# Patient Record
Sex: Male | Born: 1990 | Race: Black or African American | Hispanic: No | Marital: Single | Smoking: Current every day smoker
Health system: Southern US, Community
[De-identification: ages and names within clinical notes are randomized; demographics above are authoritative.]

---

## 2009-02-11 ENCOUNTER — Emergency Department: Payer: Self-pay | Admitting: Internal Medicine

## 2009-04-04 ENCOUNTER — Inpatient Hospital Stay: Payer: Self-pay | Admitting: Psychiatry

## 2009-07-13 ENCOUNTER — Emergency Department: Payer: Self-pay | Admitting: Unknown Physician Specialty

## 2009-11-18 ENCOUNTER — Emergency Department: Payer: Self-pay | Admitting: Emergency Medicine

## 2010-01-12 ENCOUNTER — Emergency Department: Payer: Self-pay | Admitting: Unknown Physician Specialty

## 2010-06-13 ENCOUNTER — Emergency Department: Payer: Self-pay | Admitting: Emergency Medicine

## 2010-10-08 ENCOUNTER — Emergency Department: Payer: Self-pay | Admitting: *Deleted

## 2010-12-15 ENCOUNTER — Emergency Department: Payer: Self-pay | Admitting: Emergency Medicine

## 2012-03-25 ENCOUNTER — Emergency Department: Payer: Self-pay | Admitting: Emergency Medicine

## 2012-03-25 LAB — RAPID INFLUENZA A&B ANTIGENS

## 2012-03-28 LAB — BETA STREP CULTURE(ARMC)

## 2012-11-28 ENCOUNTER — Emergency Department: Payer: Self-pay | Admitting: Internal Medicine

## 2013-12-05 ENCOUNTER — Emergency Department: Payer: Self-pay | Admitting: Internal Medicine

## 2013-12-05 LAB — CBC WITH DIFFERENTIAL/PLATELET
Basophil #: 0 10*3/uL (ref 0.0–0.1)
Basophil %: 0.6 %
Eosinophil #: 0.3 10*3/uL (ref 0.0–0.7)
Eosinophil %: 4.1 %
HCT: 44.3 % (ref 40.0–52.0)
HGB: 14.6 g/dL (ref 13.0–18.0)
Lymphocyte #: 1.3 10*3/uL (ref 1.0–3.6)
Lymphocyte %: 18.7 %
MCH: 29.2 pg (ref 26.0–34.0)
MCHC: 33 g/dL (ref 32.0–36.0)
MCV: 88 fL (ref 80–100)
MONOS PCT: 11.4 %
Monocyte #: 0.8 x10 3/mm (ref 0.2–1.0)
Neutrophil #: 4.5 10*3/uL (ref 1.4–6.5)
Neutrophil %: 65.2 %
PLATELETS: 198 10*3/uL (ref 150–440)
RBC: 5.01 10*6/uL (ref 4.40–5.90)
RDW: 13.8 % (ref 11.5–14.5)
WBC: 6.8 10*3/uL (ref 3.8–10.6)

## 2013-12-05 LAB — COMPREHENSIVE METABOLIC PANEL
ANION GAP: 7 (ref 7–16)
AST: 31 U/L (ref 15–37)
Albumin: 4 g/dL (ref 3.4–5.0)
Alkaline Phosphatase: 82 U/L
BILIRUBIN TOTAL: 0.7 mg/dL (ref 0.2–1.0)
BUN: 20 mg/dL — ABNORMAL HIGH (ref 7–18)
CREATININE: 1.19 mg/dL (ref 0.60–1.30)
Calcium, Total: 8.8 mg/dL (ref 8.5–10.1)
Chloride: 105 mmol/L (ref 98–107)
Co2: 29 mmol/L (ref 21–32)
EGFR (African American): >60
Glucose: 91 mg/dL (ref 65–99)
Osmolality: 283 (ref 275–301)
POTASSIUM: 3.5 mmol/L (ref 3.5–5.1)
SGPT (ALT): 21 U/L
Sodium: 141 mmol/L (ref 136–145)
Total Protein: 7.6 g/dL (ref 6.4–8.2)

## 2013-12-05 LAB — URINALYSIS, COMPLETE
BACTERIA: NONE SEEN
BILIRUBIN, UR: NEGATIVE
BLOOD: NEGATIVE
Glucose,UR: NEGATIVE mg/dL (ref 0–75)
Nitrite: NEGATIVE
PH: 6 (ref 4.5–8.0)
PROTEIN: NEGATIVE
RBC,UR: 2 /HPF (ref 0–5)
SPECIFIC GRAVITY: 1.025 (ref 1.003–1.030)
Squamous Epithelial: <1
WBC UR: 15 /HPF (ref 0–5)

## 2014-09-06 ENCOUNTER — Emergency Department
Admission: EM | Admit: 2014-09-06 | Discharge: 2014-09-06 | Disposition: A | Discharge status: Home / Self Care | Payer: Self-pay | Attending: Emergency Medicine | Admitting: Emergency Medicine

## 2014-09-06 ENCOUNTER — Encounter: Payer: Self-pay | Admitting: Emergency Medicine

## 2014-09-06 DIAGNOSIS — K0510 Chronic gingivitis, plaque induced: Secondary | ICD-10-CM | POA: Insufficient documentation

## 2014-09-06 DIAGNOSIS — Z720 Tobacco use: Secondary | ICD-10-CM | POA: Insufficient documentation

## 2014-09-06 DIAGNOSIS — K029 Dental caries, unspecified: Secondary | ICD-10-CM | POA: Insufficient documentation

## 2014-09-06 MED ORDER — AMOXICILLIN 500 MG PO CAPS
500.0000 mg | ORAL_CAPSULE | Freq: Once | ORAL | Status: AC
Start: 2014-09-06 — End: 2014-09-06
  Administered 2014-09-06: 500 mg via ORAL
  Filled 2014-09-06: qty 1

## 2014-09-06 MED ORDER — LIDOCAINE VISCOUS 2 % MT SOLN
15.0000 mL | Freq: Once | OROMUCOSAL | Status: AC
  Administered 2014-09-06: 15 mL via OROMUCOSAL
  Filled 2014-09-06: qty 15

## 2014-09-06 MED ORDER — KETOROLAC TROMETHAMINE 10 MG PO TABS: 10.0000 mg | ORAL_TABLET | Freq: Three times a day (TID) | ORAL | Status: DC | PRN

## 2014-09-06 MED ORDER — OXYCODONE-ACETAMINOPHEN 5-325 MG PO TABS
1.0000 | ORAL_TABLET | Freq: Once | ORAL | Status: AC
Start: 2014-09-06 — End: 2014-09-06
  Administered 2014-09-06: 1 via ORAL
  Filled 2014-09-06: qty 1

## 2014-09-06 MED ORDER — KETOROLAC TROMETHAMINE 10 MG PO TABS: 10.0000 mg | ORAL_TABLET | Freq: Once | ORAL | Status: DC

## 2014-09-06 MED ORDER — AMOXICILLIN 500 MG PO CAPS: 500.0000 mg | ORAL_CAPSULE | Freq: Two times a day (BID) | ORAL | Status: AC

## 2014-09-06 NOTE — ED Notes (Signed)
Patient present to ED with left sided dental pain since yesterday, swelling noted to left side of gum. Patient denies any other complaints at this time. NAD noted at this time. Patient alert and oriented x 4, respirations even and unlabored. Call bell within reach, family at bedside.

## 2014-09-06 NOTE — ED Notes (Signed)

## 2014-09-06 NOTE — ED Notes (Signed)
Patient ambulatory to triage with steady gait, without difficulty or distress noted; pt reports left sided dental pain since yesterday 

## 2014-09-06 NOTE — ED Provider Notes (Signed)
[facility] Emergency Department Provider Note  ____________________________________________  Time seen: 5:30 AM  I have reviewed the triage vital signs and the nursing notes.   HISTORY  Chief Complaint Dental Pain      HPI Justin Camacho is a 24 y.o. male presents with left maxillary dental pain times one day. Patient denies any fever no neck pain or swelling   Past medical history None There are no active problems to display for this patient.   Past surgical history None  Current Outpatient Rx  Name  Route  Sig  Dispense  Refill  . amoxicillin (AMOXIL) 500 MG capsule   Oral   Take 1 capsule (500 mg total) by mouth 2 (two) times daily.   20 capsule   0   . ketorolac (TORADOL) 10 MG tablet   Oral   Take 1 tablet (10 mg total) by mouth every 8 (eight) hours as needed.   20 tablet   0     Allergies Review of patient's allergies indicates no known allergies.  No family history on file.  Social History History  Substance Use Topics  . Smoking status: Current Every Day Smoker -- 1.00 packs/day    Types: Cigarettes  . Smokeless tobacco: Not on file  . Alcohol Use: No    Review of Systems  Constitutional: Negative for fever. Eyes: Negative for visual changes. ENT: Negative for sore throat. Positive dental caries Cardiovascular: Negative for chest pain. Respiratory: Negative for shortness of breath. Gastrointestinal: Negative for abdominal pain, vomiting and diarrhea. Genitourinary: Negative for dysuria. Musculoskeletal: Negative for back pain. Skin: Negative for rash. Neurological: Negative for headaches, focal weakness or numbness.   10-point ROS otherwise negative.  ____________________________________________   PHYSICAL EXAM:  VITAL SIGNS: ED Triage Vitals  Enc Vitals Group     BP 09/06/14 0512 134/101 mmHg     Pulse Rate 09/06/14 0512 60     Resp 09/06/14 0512 18     Temp 09/06/14 0512 98 F (36.7 C)   Temp Source 09/06/14 0512 Oral     SpO2 09/06/14 0512 95 %     Weight 09/06/14 0512 152 lb (68.947 kg)     Height 09/06/14 0512  (1.753 m)     Head Cir --      Peak Flow --      Pain Score 09/06/14 0512 10     Pain Loc --      Pain Edu? --      Excl. in GC? --     Constitutional: Alert and oriented. Well appearing and in no distress. Eyes: Conjunctivae are normal. PERRL. Normal extraocular movements. ENT   Head: Normocephalic and atraumatic.   Nose: No congestion/rhinnorhea.   Mouth/Throat: Mucous membranes are moist. Multiple dental caries noted and gingivitis   Neck: No stridor. Hematological/Lymphatic/Immunilogical: No cervical lymphadenopathy.     INITIAL IMPRESSION / ASSESSMENT AND PLAN / ED COURSE  Pertinent labs & imaging results that were available during my care of the patient were reviewed by me and considered in my medical decision making (see chart for details).  Patient received viscous lidocaine amoxicillin and Percocet  ____________________________________________   FINAL CLINICAL IMPRESSION(S) / ED DIAGNOSES  Final diagnoses:  Pain due to dental caries  Gingivitis      Darci Current, MD 09/06/14 [phone]

## 2014-09-06 NOTE — Discharge Instructions (Signed)
Dental Caries °Dental caries (also called tooth decay) is the most common oral disease. It can occur at any age but is more common in children and young adults.  °HOW DENTAL CARIES DEVELOPS  °The process of decay begins when bacteria and foods (particularly sugars and starches) combine in your mouth to produce plaque. Plaque is a substance that sticks to the hard, outer surface of a tooth (enamel). The bacteria in plaque produce acids that attack enamel. These acids may also attack the root surface of a tooth (cementum) if it is exposed. Repeated attacks dissolve these surfaces and create holes in the tooth (cavities). If left untreated, the acids destroy the other layers of the tooth.  °RISK FACTORS °· Frequent sipping of sugary beverages.   °· Frequent snacking on sugary and starchy foods, especially those that easily get stuck in the teeth.   °· Poor oral hygiene.   °· Dry mouth.   °· Substance abuse such as methamphetamine abuse.   °· Broken or poor-fitting dental restorations.   °· Eating disorders.   °· Gastroesophageal reflux disease (GERD).   °· Certain radiation treatments to the head and neck. °SYMPTOMS °In the early stages of dental caries, symptoms are seldom present. Sometimes white, chalky areas may be seen on the enamel or other tooth layers. In later stages, symptoms may include: °· Pits and holes on the enamel. °· Toothache after sweet, hot, or cold foods or drinks are consumed. °· Pain around the tooth. °· Swelling around the tooth. °DIAGNOSIS  °Most of the time, dental caries is detected during a regular dental checkup. A diagnosis is made after a thorough medical and dental history is taken and the surfaces of your teeth are checked for signs of dental caries. Sometimes special instruments, such as lasers, are used to check for dental caries. Dental X-ray exams may be taken so that areas not visible to the eye (such as between the contact areas of the teeth) can be checked for cavities.    °TREATMENT  °If dental caries is in its early stages, it may be reversed with a fluoride treatment or an application of a remineralizing agent at the dental office. Thorough brushing and flossing at home is needed to aid these treatments. If it is in its later stages, treatment depends on the location and extent of tooth destruction:  °· If a small area of the tooth has been destroyed, the destroyed area will be removed and cavities will be filled with a material such as gold, silver amalgam, or composite resin.   °· If a large area of the tooth has been destroyed, the destroyed area will be removed and a cap (crown) will be fitted over the remaining tooth structure.   °· If the center part of the tooth (pulp) is affected, a procedure called a root canal will be needed before a filling or crown can be placed.   °· If most of the tooth has been destroyed, the tooth may need to be pulled (extracted). °HOME CARE INSTRUCTIONS °You can prevent, stop, or reverse dental caries at home by practicing good oral hygiene. Good oral hygiene includes: °· Thoroughly cleaning your teeth at least twice a day with a toothbrush and dental floss.   °· Using a fluoride toothpaste. A fluoride mouth rinse may also be used if recommended by your dentist or health care provider.   °· Restricting the amount of sugary and starchy foods and sugary liquids you consume.   °· Avoiding frequent snacking on these foods and sipping of these liquids.   °· Keeping regular visits with   a dentist for checkups and cleanings. PREVENTION   Practice good oral hygiene.  Consider a dental sealant. A dental sealant is a coating material that is applied by your dentist to the pits and grooves of teeth. The sealant prevents food from being trapped in them. It may protect the teeth for several years.  Ask about fluoride supplements if you live in a community without fluorinated water or with water that has a low fluoride content. Use fluoride supplements  as directed by your dentist or health care provider.  Allow fluoride varnish applications to teeth if directed by your dentist or health care provider. Document Released: 10/20/2001 Document Revised: 06/14/2013 Document Reviewed: 01/31/2012 [facility] Patient Information 2015 [place], [place]. This information is not intended to replace advice given to you by your health care provider. Make sure you discuss any questions you have with your health care provider.  Gingivitis Gingivitis is a form of gum (periodontal) disease that causes redness, soreness, and swelling (inflammation) of your gums. CAUSES The most common cause of gingivitis is poor oral hygiene. A sticky substance made of bacteria, mucus, and food particles (plaque), is deposited on the exposed part of teeth. As plaque builds up, it reacts with the saliva in your mouth to form something called  tartar. Tartar is a hard deposit that becomes trapped around the base of the tooth. Plaque and tartar irritate the gums, leading to the formation of gingivitis. Other factors that increase your risk for gingivitis include:   Tobacco use.  Diabetes.  Older age.  Certain medications.  Certain viral or fungal infections.  Dry mouth.  Hormonal changes such as during pregnancy.  Poor nutrition.  Substance abuse.  Poor fitting dental restorations or appliances. SYMPTOMS You may notice inflammation of the soft tissue (gingiva) around the teeth. When these tissues become inflamed, they bleed easily, especially during flossing or brushing. The gums may also be:   Tender to the touch.  Bright red, purple red, or have a shiny appearance.  Swollen.  Wearing away from the teeth (receding), which exposes more of the tooth. Bad breath is often present. Continued infection around teeth can eventually cause cavities and loosen teeth. This may lead to eventual tooth loss. DIAGNOSIS A medical and dental history will be taken. Your mouth, teeth,  and gums will be examined. Your dentist will look for soft, swollen purple-red, irritated gums. There may be deposits of plaque and tartar at the base of the teeth. Your gums will be looked at for the degree of redness, puffiness, and bleeding tendencies. Your dentist will see if any of the teeth are loose. X-rays may be taken to see if the inflammation has spread to the supporting structures of the teeth. TREATMENT The goal is to reduce and reverse the inflammation. Proper treatment can usually reverse the symptoms of gingivitis and prevent further progression of the disease. Have your teeth cleaned. During the cleaning, all plaque and tartar will be removed. Instruction for proper home care will be given. You will need regular professional cleanings and check-ups in the future. HOME CARE INSTRUCTIONS  Brush your teeth twice a day and floss at least once per day. When flossing, it is best to floss first then brush.  Limit sugar between meals and maintain a well-balanced diet.  Even the best dental hygiene will not prevent plaque from developing. It is necessary for you to see your dentist on a regular basis for cleaning and regular checkups.  Your dentist can recommend proper oral hygiene  and mouth care and suggest special toothpastes or mouth rinses.  Stop smoking. SEEK DENTAL OR MEDICAL CARE IF:  You have painful, reddened tissue around your teeth, or you have puffy swollen gums.  You have difficulty chewing.  You notice any loose or infected teeth.  You have swollen glands.  Your gums bleed easily when you brush your teeth or are very tender to the touch. Document Released: 07/24/2000 Document Revised: 04/22/2011 Document Reviewed: 05/04/2010 [facility] Patient Information 2015 [place], [place]. This information is not intended to replace advice given to you by your health care provider. Make sure you discuss any questions you have with your health care provider.

## 2015-01-23 ENCOUNTER — Emergency Department
Admission: EM | Admit: 2015-01-23 | Discharge: 2015-01-23 | Disposition: A | Discharge status: Home / Self Care | Payer: Self-pay

## 2015-01-23 ENCOUNTER — Emergency Department
Admission: EM | Admit: 2015-01-23 | Discharge: 2015-01-23 | Discharge status: Against Medical Advice | Payer: Self-pay | Attending: Emergency Medicine | Admitting: Emergency Medicine

## 2015-01-23 ENCOUNTER — Encounter: Payer: Self-pay | Admitting: Emergency Medicine

## 2015-01-23 DIAGNOSIS — Z87891 Personal history of nicotine dependence: Secondary | ICD-10-CM | POA: Insufficient documentation

## 2015-01-23 DIAGNOSIS — H109 Unspecified conjunctivitis: Secondary | ICD-10-CM | POA: Insufficient documentation

## 2015-01-23 NOTE — ED Notes (Addendum)
Patient ambulatory to triage with steady gait, without difficulty or distress noted; pt c/o bilat eye irritation/redness/pain tonight with no known cause

## 2015-04-28 ENCOUNTER — Encounter: Payer: Self-pay | Admitting: *Deleted

## 2015-04-28 DIAGNOSIS — F17210 Nicotine dependence, cigarettes, uncomplicated: Secondary | ICD-10-CM | POA: Insufficient documentation

## 2015-04-28 DIAGNOSIS — K0889 Other specified disorders of teeth and supporting structures: Secondary | ICD-10-CM | POA: Insufficient documentation

## 2015-04-28 MED ORDER — IBUPROFEN 400 MG PO TABS
400.0000 mg | ORAL_TABLET | Freq: Once | ORAL | Status: AC
  Administered 2015-04-28: 400 mg via ORAL
  Filled 2015-04-28: qty 1

## 2015-04-28 NOTE — ED Notes (Signed)
Pt c/o L posterior wisdom teeth, pain starting last night. Pt drove self to ED, explained he could receive no narcotics unless he found someone to drive him home and they came to the ED. Pt states he needs a referral for a dentist. Pt offered ibuprofen for pain after explaining policy regarding narcotics and driving.

## 2015-04-29 ENCOUNTER — Emergency Department
Admission: EM | Admit: 2015-04-29 | Discharge: 2015-04-29 | Disposition: A | Discharge status: Home / Self Care | Payer: Self-pay | Attending: Emergency Medicine | Admitting: Emergency Medicine

## 2015-04-29 NOTE — ED Notes (Signed)
Patient called x 3 for treatment; no response.

## 2015-05-31 ENCOUNTER — Encounter: Payer: Self-pay | Admitting: Emergency Medicine

## 2015-05-31 ENCOUNTER — Emergency Department
Admission: EM | Admit: 2015-05-31 | Discharge: 2015-05-31 | Disposition: A | Discharge status: Home / Self Care | Payer: Self-pay | Attending: Emergency Medicine | Admitting: Emergency Medicine

## 2015-05-31 DIAGNOSIS — L409 Psoriasis, unspecified: Secondary | ICD-10-CM | POA: Insufficient documentation

## 2015-05-31 DIAGNOSIS — F17210 Nicotine dependence, cigarettes, uncomplicated: Secondary | ICD-10-CM | POA: Insufficient documentation

## 2015-05-31 MED ORDER — CLOTRIMAZOLE-BETAMETHASONE 1-0.05 % EX CREA: TOPICAL_CREAM | CUTANEOUS | Status: DC

## 2015-05-31 MED ORDER — HYDROXYZINE HCL 50 MG PO TABS: 50.0000 mg | ORAL_TABLET | Freq: Three times a day (TID) | ORAL | Status: DC | PRN

## 2015-05-31 MED ORDER — HYDROXYZINE HCL 50 MG PO TABS
50.0000 mg | ORAL_TABLET | Freq: Once | ORAL | Status: AC
  Administered 2015-05-31: 50 mg via ORAL
  Filled 2015-05-31: qty 1

## 2015-05-31 NOTE — Discharge Instructions (Signed)
Psoriasis Psoriasis is a long-term (chronic) skin condition. It causes raised, red patches (plaques) on your skin that look silvery. The red patches may show up anywhere on your body. They can be any size or shape.  Psoriasis can come and go. It can range from mild to very bad. It cannot be passed from one person to another (not contagious). There is no cure for this condition, but it can be helped with treatment. HOME CARE Skin Care  Apply moisturizers to your skin as needed. Only use those that your doctor has said are okay.  Apply cool compresses to the affected areas.  Do not scratch your skin.  Lifestyle  Do not use tobacco products. This includes cigarettes, chewing tobacco, and e-cigarettes. If you need help quitting, ask your doctor.  Drink little or no alcohol.   Try to lower your stress. Meditation or yoga may help.  Get sun as told by your doctor. Do not get sunburned.   Think about joining a psoriasis support group.  Medicines  Take or use over-the-counter and prescription medicines only as told by your doctor.   If you were prescribed an antibiotic, take or use it as told by your doctor. Do not stop taking the antibiotic even if your condition starts to get better. General Instructions  Keep a journal. Use this to help track what triggers an outbreak. Try to avoid any triggers.  See a counselor or social worker if you feel very sad, upset, or hopeless about your condition and these feelings affect your work or relationships.  Keep all follow-up visits as told by your doctor. This is important. GET HELP IF:   Your pain gets worse.   You have more redness or warmth in the affected areas.  You have new pain or stiffness in your joints.  Your pain or stiffness in your joints gets worse.  Your nails start to break easily.  Your nails pull away from the nail bed easily.  You have a fever.   You feel very sad (depressed).    This information is not  intended to replace advice given to you by your health care provider. Make sure you discuss any questions you have with your health care provider.   Document Released: 03/07/2004 Document Revised: 10/19/2014 Document Reviewed: 06/15/2014 Elsevier Interactive Patient Education 2016 [employer]  

## 2015-05-31 NOTE — ED Provider Notes (Signed)
[facility] Emergency Department Provider Note  ____________________________________________  Time seen: Approximately 7:46 PM  I have reviewed the triage vital signs and the nursing notes.   HISTORY  Chief Complaint Urticaria    HPI Robinette Haineshillip Doland II is a 25 y.o. male patient complaining of rash 2 weeks. Patient state the rash occurred in different parts of his body but is contrary mostly in the groin area. Patient denies any pain but states at times there is intense itching especially in the groin area. Patient denies any pain with this complaint. No palliative measures taken for this complaint.   History reviewed. No pertinent past medical history.  There are no active problems to display for this patient.   History reviewed. No pertinent past surgical history.  Current Outpatient Rx  Name  Route  Sig  Dispense  Refill  . clotrimazole-betamethasone (LOTRISONE) cream      Apply to affected area 2 times daily   15 g   1   . hydrOXYzine (ATARAX/VISTARIL) 50 MG tablet   Oral   Take 1 tablet (50 mg total) by mouth 3 (three) times daily as needed.   30 tablet   0   . ketorolac (TORADOL) 10 MG tablet   Oral   Take 1 tablet (10 mg total) by mouth every 8 (eight) hours as needed.   20 tablet   0     Allergies Review of patient's allergies indicates no known allergies.  History reviewed. No pertinent family history.  Social History Social History  Substance Use Topics  . Smoking status: Current Every Day Smoker -- 1.00 packs/day    Types: Cigarettes  . Smokeless tobacco: Never Used  . Alcohol Use: No    Review of Systems Constitutional: No fever/chills Eyes: No visual changes. ENT: No sore throat. Cardiovascular: Denies chest pain. Respiratory: Denies shortness of breath. Gastrointestinal: No abdominal pain.  No nausea, no vomiting.  No diarrhea.  No constipation. Genitourinary: Negative for dysuria. Musculoskeletal: Negative for  back pain. Skin: Positive for rash. Neurological: Negative for headaches, focal weakness or numbness.    ____________________________________________   PHYSICAL EXAM:  VITAL SIGNS: ED Triage Vitals  Enc Vitals Group     BP --      Pulse --      Resp --      Temp --      Temp src --      SpO2 --      Weight --      Height --      Head Cir --      Peak Flow --      Pain Score --      Pain Loc --      Pain Edu? --      Excl. in GC? --     Constitutional: Alert and oriented. Well appearing and in no acute distress. Eyes: Conjunctivae are normal. PERRL. EOMI. Head: Atraumatic. Nose: No congestion/rhinnorhea. Mouth/Throat: Mucous membranes are moist.  Oropharynx non-erythematous. Neck: No stridor.  No cervical spine tenderness to palpation. Hematological/Lymphatic/Immunilogical: No cervical lymphadenopathy. Cardiovascular: Normal rate, regular rhythm. Grossly normal heart sounds.  Good peripheral circulation. Respiratory: Normal respiratory effort.  No retractions. Lungs CTAB. Gastrointestinal: Soft and nontender. No distention. No abdominal bruits. No CVA tenderness. Musculoskeletal: No lower extremity tenderness nor edema.  No joint effusions. Neurologic:  Normal speech and language. No gross focal neurologic deficits are appreciated. No gait instability. Skin:  Skin is warm, dry and intact.Thickened macular lesions with  scaly borders.   Psychiatric: Mood and affect are normal. Speech and behavior are normal.  ____________________________________________   LABS (all labs ordered are listed, but only abnormal results are displayed)  Labs Reviewed - No data to display ____________________________________________  EKG   ____________________________________________  RADIOLOGY   ____________________________________________   PROCEDURES  Procedure(s) performed: None  Critical Care performed: No  ____________________________________________   INITIAL  IMPRESSION / ASSESSMENT AND PLAN / ED COURSE  Pertinent labs & imaging results that were available during my care of the patient were reviewed by me and considered in my medical decision making (see chart for details).  Psoriasis. Patient given discharge care instructions. Patient given a prescription for Lotrisone. Patient [facility]. ____________________________________________   FINAL CLINICAL IMPRESSION(S) / ED DIAGNOSES  Final diagnoses:  Psoriasis (a type of skin inflammation)      Joni Reining, PA-C 05/31/15 2003  Maurilio Lovely, MD 05/31/15 2326

## 2015-05-31 NOTE — ED Notes (Signed)
Pt presents to ED via POV with c/o of hives occurring for x2 weeks. Pt states sx occurring all over body, specifically groin area. Pt denies pain at this time. Pt alert and oriented x4.

## 2016-07-21 ENCOUNTER — Emergency Department
Admission: EM | Admit: 2016-07-21 | Discharge: 2016-07-21 | Disposition: A | Discharge status: Home / Self Care | Payer: No Typology Code available for payment source | Attending: Emergency Medicine | Admitting: Emergency Medicine

## 2016-07-21 ENCOUNTER — Emergency Department: Payer: No Typology Code available for payment source

## 2016-07-21 DIAGNOSIS — Y9389 Activity, other specified: Secondary | ICD-10-CM | POA: Diagnosis not present

## 2016-07-21 DIAGNOSIS — Y92410 Unspecified street and highway as the place of occurrence of the external cause: Secondary | ICD-10-CM | POA: Diagnosis not present

## 2016-07-21 DIAGNOSIS — Z79899 Other long term (current) drug therapy: Secondary | ICD-10-CM | POA: Insufficient documentation

## 2016-07-21 DIAGNOSIS — F17210 Nicotine dependence, cigarettes, uncomplicated: Secondary | ICD-10-CM | POA: Diagnosis not present

## 2016-07-21 DIAGNOSIS — Y998 Other external cause status: Secondary | ICD-10-CM | POA: Diagnosis not present

## 2016-07-21 DIAGNOSIS — S0990XA Unspecified injury of head, initial encounter: Secondary | ICD-10-CM | POA: Insufficient documentation

## 2016-07-21 MED ORDER — TRAMADOL HCL 50 MG PO TABS
ORAL_TABLET | ORAL | Status: AC
  Filled 2016-07-21: qty 1

## 2016-07-21 MED ORDER — KETOROLAC TROMETHAMINE 30 MG/ML IJ SOLN
30.0000 mg | Freq: Once | INTRAMUSCULAR | Status: AC
  Administered 2016-07-21: 30 mg via INTRAMUSCULAR
  Filled 2016-07-21: qty 1

## 2016-07-21 MED ORDER — CYCLOBENZAPRINE HCL 5 MG PO TABS: 5.0000 mg | ORAL_TABLET | Freq: Three times a day (TID) | ORAL | 0 refills | Status: AC | PRN

## 2016-07-21 MED ORDER — MELOXICAM 7.5 MG PO TABS: 7.5000 mg | ORAL_TABLET | Freq: Every day | ORAL | 1 refills | Status: AC

## 2016-07-21 MED ORDER — TRAMADOL HCL 50 MG PO TABS
50.0000 mg | ORAL_TABLET | Freq: Once | ORAL | Status: AC
  Administered 2016-07-21: 50 mg via ORAL
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
## 2016-07-21 NOTE — ED Notes (Signed)
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
## 2018-10-21 ENCOUNTER — Other Ambulatory Visit: Payer: Self-pay

## 2018-10-21 ENCOUNTER — Encounter (HOSPITAL_COMMUNITY): Payer: Self-pay | Admitting: Emergency Medicine

## 2018-10-21 ENCOUNTER — Emergency Department (HOSPITAL_COMMUNITY)
Admission: EM | Admit: 2018-10-21 | Discharge: 2018-10-21 | Disposition: A | Discharge status: Home / Self Care | Payer: Self-pay | Attending: Emergency Medicine | Admitting: Emergency Medicine

## 2018-10-21 DIAGNOSIS — Z20828 Contact with and (suspected) exposure to other viral communicable diseases: Secondary | ICD-10-CM | POA: Insufficient documentation

## 2018-10-21 DIAGNOSIS — M7918 Myalgia, other site: Secondary | ICD-10-CM | POA: Insufficient documentation

## 2018-10-21 DIAGNOSIS — J029 Acute pharyngitis, unspecified: Secondary | ICD-10-CM | POA: Insufficient documentation

## 2018-10-21 DIAGNOSIS — R0981 Nasal congestion: Secondary | ICD-10-CM | POA: Insufficient documentation

## 2018-10-21 DIAGNOSIS — Z20822 Contact with and (suspected) exposure to covid-19: Secondary | ICD-10-CM

## 2018-10-21 DIAGNOSIS — J069 Acute upper respiratory infection, unspecified: Secondary | ICD-10-CM | POA: Insufficient documentation

## 2018-10-21 DIAGNOSIS — F17210 Nicotine dependence, cigarettes, uncomplicated: Secondary | ICD-10-CM | POA: Insufficient documentation

## 2018-10-21 MED ORDER — LORATADINE 10 MG PO TABS: 10.0000 mg | ORAL_TABLET | Freq: Every day | ORAL | 0 refills | Status: AC

## 2018-10-21 MED ORDER — FLUTICASONE PROPIONATE 50 MCG/ACT NA SUSP: 2.0000 | Freq: Every day | NASAL | 0 refills | Status: DC

## 2018-10-21 NOTE — ED Provider Notes (Signed)
[facility] EMERGENCY DEPARTMENT Provider Note   CSN: [id number] Arrival date & time: 10/21/18  3500     History   Chief Complaint Chief Complaint  Patient presents with  . Sore Throat  . Generalized Body Aches    HPI Justin Camacho is a 28 y.o. male.     HPI   Patient is a 28 year old male who presents to the emergency department today complaining of rhinorrhea, nasal congestion, sore throat, body aches and a productive cough that started 1 day ago.  He also complains of sneezing.  He denies any known diagnosis of allergies in the past.  He denies any chest pain, shortness of breath or other symptoms.  He denies any known COVID exposures.  History reviewed. No pertinent past medical history.  There are no active problems to display for this patient.   History reviewed. No pertinent surgical history.      Home Medications    Prior to Admission medications   Medication Sig Start Date End Date Taking? Authorizing Provider  clotrimazole-betamethasone (LOTRISONE) cream Apply to affected area 2 times daily 05/31/15   Sable Feil, PA-C  fluticasone [facility]) 50 MCG/ACT nasal spray Place 2 sprays into both nostrils daily. 10/21/18   Jagger Demonte S, PA-C  hydrOXYzine (ATARAX/VISTARIL) 50 MG tablet Take 1 tablet (50 mg total) by mouth 3 (three) times daily as needed. 05/31/15   Sable Feil, PA-C  ketorolac (TORADOL) 10 MG tablet Take 1 tablet (10 mg total) by mouth every 8 (eight) hours as needed. 09/06/14   Gregor Hams, MD  loratadine (CLARITIN) 10 MG tablet Take 1 tablet (10 mg total) by mouth daily. 10/21/18 11/20/18  Lilian Fuhs S, PA-C    Family History No family history on file.  Social History Social History   Tobacco Use  . Smoking status: Current Every Day Smoker    Packs/day: 1.00    Types: Cigarettes  . Smokeless tobacco: Never Used  Substance Use Topics  . Alcohol use: No  . Drug use: No     Allergies   Patient has no  known allergies.   Review of Systems Review of Systems  Constitutional: Negative for fever.  HENT: Positive for congestion, rhinorrhea, sneezing and sore throat.   Respiratory: Positive for cough. Negative for shortness of breath.   Cardiovascular: Negative for chest pain.  Gastrointestinal: Negative for abdominal pain, constipation, diarrhea, nausea and vomiting.  Musculoskeletal: Positive for myalgias.  Neurological: Negative for headaches.     Physical Exam Updated Vital Signs BP 124/75   Pulse 78   Temp 99.8 F (37.7 C) (Oral)   Resp 20   Ht 5\' 9"  (1.753 m)   Wt 74.4 kg   SpO2 100%   BMI 24.22 kg/m   Physical Exam Constitutional:      General: He is not in acute distress.    Appearance: He is well-developed. He is not ill-appearing or toxic-appearing.  HENT:     Mouth/Throat:     Mouth: Mucous membranes are moist.     Pharynx: No pharyngeal swelling, oropharyngeal exudate or posterior oropharyngeal erythema.     Tonsils: No tonsillar exudate or tonsillar abscesses. 0 on the right. 0 on the left.  Eyes:     Conjunctiva/sclera: Conjunctivae normal.  Cardiovascular:     Rate and Rhythm: Normal rate and regular rhythm.     Heart sounds: Normal heart sounds. No murmur.  Pulmonary:     Effort: Pulmonary effort is normal.  Breath sounds: Normal breath sounds. No stridor. No wheezing, rhonchi or rales.  Abdominal:     Palpations: Abdomen is soft.     Tenderness: There is no abdominal tenderness.  Lymphadenopathy:     Cervical: No cervical adenopathy.  Skin:    General: Skin is warm and dry.  Neurological:     Mental Status: He is alert and oriented to person, place, and time.      ED Treatments / Results  Labs (all labs ordered are listed, but only abnormal results are displayed) Labs Reviewed  NOVEL CORONAVIRUS, NAA (HOSP ORDER, SEND-OUT TO REF LAB; TAT 18-24 HRS)    EKG None  Radiology No results found.  Procedures Procedures (including  critical care time)  Medications Ordered in ED Medications - No data to display   Initial Impression / Assessment and Plan / ED Course  I have reviewed the triage vital signs and the nursing notes.  Pertinent labs & imaging results that were available during my care of the patient were reviewed by me and considered in my medical decision making (see chart for details).     Final Clinical Impressions(s) / ED Diagnoses   Final diagnoses:  Suspected Covid-19 Virus Infection  Upper respiratory tract infection, unspecified type   Patient is a 28 year old male who presents to the emergency department today complaining of rhinorrhea, nasal congestion, sore throat, body aches and a productive cough that started 1 day ago.  He also complains of sneezing.  He denies any known diagnosis of allergies in the past.  He denies any chest pain, shortness of breath or other symptoms.  He denies any known COVID exposures.  Vital signs reassuring.  Exam also reassuring.  Patient well-appearing.  Symptoms could be representative of seasonal allergies versus viral sinusitis and will treat with Claritin and Flonase.  However, in light of recent COVID pandemic will test him for this.  Advised self quarantine in the meantime of waiting for results.  Advised on plan for follow-up and return precautions.  All questions answered.  Patient stable for discharge.    Justin Camacho was evaluated in Emergency Department on 10/21/2018 for the symptoms described in the history of present illness. He was evaluated in the context of the global COVID-19 pandemic, which necessitated consideration that the patient might be at risk for infection with the SARS-CoV-2 virus that causes COVID-19. Institutional protocols and algorithms that pertain to the evaluation of patients at risk for COVID-19 are in a state of rapid change based on information released by regulatory bodies including the CDC and federal and state organizations.  These policies and algorithms were followed during the patient's care in the ED.   ED Discharge Orders         Ordered    fluticasone (FLONASE) 50 MCG/ACT nasal spray  Daily     10/21/18 1020    loratadine (CLARITIN) 10 MG tablet  Daily     10/21/18 1020           Yanice Maqueda S, PA-C 10/21/18 1021    Arby BarrettePfeiffer, Marcy, MD 10/23/18 1150

## 2018-10-21 NOTE — ED Notes (Signed)
Reports productive cough

## 2018-10-21 NOTE — Discharge Instructions (Addendum)
You were given claritin and flonase for your symptoms. Please take medications as directed.   Today you were were tested for the coronavirus.  The results will be available in the next 3 to 5 days.  If the results are positive the hospital will contact you.  If they are negative the hospital would not contact you.  You will need to self quarantine until you are aware of your results.  If they are positive you will need to self quarantine as directed below.  You should be isolated for at least 7 days since the onset of your symptoms AND >72 hours after symptoms resolution (absence of fever without the use of fever reducing medication and improvement in respiratory symptoms), whichever is longer  Please follow up with your primary care provider within 5-7 days for re-evaluation of your symptoms. If you do not have a primary care provider, information for a healthcare clinic has been provided for you to make arrangements for follow up care. Please return to the emergency department for any new or worsening symptoms.

## 2018-10-21 NOTE — ED Triage Notes (Signed)
Pt states he started having a runny nose, sore throat and bodyaches yesterday. Denies any known exposure to covid.

## 2018-10-22 LAB — NOVEL CORONAVIRUS, NAA (HOSP ORDER, SEND-OUT TO REF LAB; TAT 18-24 HRS): SARS-CoV-2, NAA: NOT DETECTED

## 2019-02-05 ENCOUNTER — Emergency Department (HOSPITAL_COMMUNITY): Payer: Self-pay

## 2019-02-05 ENCOUNTER — Other Ambulatory Visit: Payer: Self-pay

## 2019-02-05 ENCOUNTER — Emergency Department (HOSPITAL_COMMUNITY)
Admission: EM | Admit: 2019-02-05 | Discharge: 2019-02-06 | Disposition: A | Discharge status: Home / Self Care | Payer: Self-pay | Attending: Emergency Medicine | Admitting: Emergency Medicine

## 2019-02-05 DIAGNOSIS — R45851 Suicidal ideations: Secondary | ICD-10-CM | POA: Insufficient documentation

## 2019-02-05 DIAGNOSIS — F17210 Nicotine dependence, cigarettes, uncomplicated: Secondary | ICD-10-CM | POA: Insufficient documentation

## 2019-02-05 DIAGNOSIS — Z046 Encounter for general psychiatric examination, requested by authority: Secondary | ICD-10-CM | POA: Insufficient documentation

## 2019-02-05 DIAGNOSIS — F329 Major depressive disorder, single episode, unspecified: Secondary | ICD-10-CM | POA: Insufficient documentation

## 2019-02-05 DIAGNOSIS — Z20828 Contact with and (suspected) exposure to other viral communicable diseases: Secondary | ICD-10-CM | POA: Insufficient documentation

## 2019-02-05 DIAGNOSIS — Z79899 Other long term (current) drug therapy: Secondary | ICD-10-CM | POA: Insufficient documentation

## 2019-02-05 DIAGNOSIS — R7989 Other specified abnormal findings of blood chemistry: Secondary | ICD-10-CM | POA: Insufficient documentation

## 2019-02-05 DIAGNOSIS — Y906 Blood alcohol level of 120-199 mg/100 ml: Secondary | ICD-10-CM | POA: Insufficient documentation

## 2019-02-05 DIAGNOSIS — F10920 Alcohol use, unspecified with intoxication, uncomplicated: Secondary | ICD-10-CM | POA: Insufficient documentation

## 2019-02-05 LAB — RAPID URINE DRUG SCREEN, HOSP PERFORMED
Amphetamines: NOT DETECTED
Barbiturates: NOT DETECTED
Benzodiazepines: POSITIVE — AB
Cocaine: NOT DETECTED
Opiates: NOT DETECTED
Tetrahydrocannabinol: POSITIVE — AB

## 2019-02-05 LAB — SALICYLATE LEVEL: Salicylate Lvl: <7 mg/dL — ABNORMAL LOW (ref 7.0–30.0)

## 2019-02-05 LAB — COMPREHENSIVE METABOLIC PANEL
ALT: 14 U/L (ref 0–44)
AST: 33 U/L (ref 15–41)
Albumin: 4.2 g/dL (ref 3.5–5.0)
Alkaline Phosphatase: 61 U/L (ref 38–126)
Anion gap: 11 (ref 5–15)
BUN: 24 mg/dL — ABNORMAL HIGH (ref 6–20)
CO2: 27 mmol/L (ref 22–32)
Calcium: 9.1 mg/dL (ref 8.9–10.3)
Chloride: 105 mmol/L (ref 98–111)
Creatinine, Ser: 1.4 mg/dL — ABNORMAL HIGH (ref 0.61–1.24)
GFR calc Af Amer: >60 mL/min (ref >60)
GFR calc non Af Amer: >60 mL/min (ref >60)
Glucose, Bld: 85 mg/dL (ref 70–99)
Potassium: 4.4 mmol/L (ref 3.5–5.1)
Sodium: 143 mmol/L (ref 135–145)
Total Bilirubin: 1 mg/dL (ref 0.3–1.2)
Total Protein: 7.7 g/dL (ref 6.5–8.1)

## 2019-02-05 LAB — ACETAMINOPHEN LEVEL: Acetaminophen (Tylenol), Serum: <10 ug/mL — ABNORMAL LOW (ref 10–30)

## 2019-02-05 LAB — CBC
HCT: 46.8 % (ref 39.0–52.0)
Hemoglobin: 14.8 g/dL (ref 13.0–17.0)
MCH: 28.6 pg (ref 26.0–34.0)
MCHC: 31.6 g/dL (ref 30.0–36.0)
MCV: 90.3 fL (ref 80.0–100.0)
Platelets: 213 10*3/uL (ref 150–400)
RBC: 5.18 MIL/uL (ref 4.22–5.81)
RDW: 14.3 % (ref 11.5–15.5)
WBC: 6.1 10*3/uL (ref 4.0–10.5)
nRBC: 0 % (ref 0.0–0.2)

## 2019-02-05 LAB — ETHANOL: Alcohol, Ethyl (B): 199 mg/dL — ABNORMAL HIGH (ref <10)

## 2019-02-05 MED ORDER — SODIUM CHLORIDE 0.9 % IV BOLUS
1000.0000 mL | Freq: Once | INTRAVENOUS | Status: AC
  Administered 2019-02-05: 1000 mL via INTRAVENOUS

## 2019-02-05 NOTE — ED Notes (Signed)
Pt's mother Marline Backbone called in regards to pt status. She is made aware no medical information can be given over the phone. Ms. Wayne Both states she is his next of kin, however, per chart she is not listed. She is also made aware that she may have to be updated at a later time as RN had no updates to give about pt. Dorothy's phone number [phone].

## 2019-02-05 NOTE — ED Notes (Addendum)
Pt was placed on Nasal cannula via 2 L/M per RN.

## 2019-02-05 NOTE — ED Notes (Signed)
Family at bedside. 

## 2019-02-05 NOTE — ED Notes (Addendum)
Pt's belongings upon arrival, white socks, white t-shirt, red sweatpants, red, multicolored hoodie, pocket knife given by PD, placed in bag w/ security for lock up and RN, EMT witnessing.
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
## 2019-02-05 NOTE — ED Notes (Addendum)
Call received from pt friend/relative@ [phone]. Caller very irate and heavy use of profane language re: pt status. Advised caller conversation would not continue until tone/language changed. Caller hung up; notice received from ED entrance that visitor came and left contact info as noted above. Caller/visitor did not leave name. RN advised. [employer]

## 2019-02-06 LAB — SARS CORONAVIRUS 2 (TAT 6-24 HRS): SARS Coronavirus 2: NEGATIVE

## 2019-02-06 MED ORDER — ALUM & MAG HYDROXIDE-SIMETH 200-200-20 MG/5ML PO SUSP: 30.0000 mL | Freq: Four times a day (QID) | ORAL | Status: DC | PRN

## 2019-02-06 MED ORDER — ACETAMINOPHEN 325 MG PO TABS: 650.0000 mg | ORAL_TABLET | ORAL | Status: DC | PRN

## 2019-02-06 MED ORDER — ONDANSETRON HCL 4 MG PO TABS: 4.0000 mg | ORAL_TABLET | Freq: Three times a day (TID) | ORAL | Status: DC | PRN

## 2019-02-06 MED ORDER — NICOTINE 14 MG/24HR TD PT24: 14.0000 mg | MEDICATED_PATCH | Freq: Every day | TRANSDERMAL | Status: DC

## 2019-02-06 NOTE — BH Assessment (Addendum)
Tele Assessment Note   Patient Name: Posey Prontohillip Mittal II MRN: [id number] Referring Physician: Dione Boozeavid, Glick, MD [facility] Location of Provider: Behavioral Health TTS Department  Robinette HainesPhillip Neria II is an 28 y.o. male.  Per EDP on 02/05/19, ,"Pt presents to the Emergency Department complaining of suicidal thoughts. Level V caveat due to altered mental status. History is provided by police officers. Patient presents to the emergency department by EMS after [place]. His girlfriend stated that he has been talking about killing himself and has been suicidal for the last several weeks. On EMS arrival he was found unresponsive but did awaken when he was brought into the ambulance. He then became agitated and combative and stated that he wanted to die. Police report that he had a knife on him. He received 5 mg of midazolam prior to ED arrival for his agitation.    During assessment pt presents calm and cooperative. Pt was asked what brought him in, pt states he does not remember. Pt is lying down and answering questions but with short responses and very little details. Pt states he was released from prison in July 2020 for having a firearm as a felon. Pt states he was incarcerated for 32 months. Pt states upon being released he has felt depressed due to lack of family support as well as having family issues with mother of his 6 children whom he lives with currently but feels he can not provide as much financially although he is employed. Pt admits to feeling actively suicidal and states, " I don't want to be here". Pt states he had a plan to try to hang himself. Pt reports only 1 previous SI attempt at the age of 28, he tried to hang himself. Pt reports he has been having active SI thought since being home from prison. Pt denied HI, AVH and self injurious behaviors. Pt reports symptoms of depression including: tearfulness, worthlessness, hopelessness, isolating himself,  anxiety and irritability. Pt reports only getting 3 to 4 hours of sleep for the last few weeks as as poor appetite. Pt denies any drug use except alcohol, however pt BAL was 199 upon his arrival at ED last night and his UDS was postive for benzo's and cannabis. Pt admitted to drinking a half a gallon of vodka yesterday. Pt reports that he just started back drinking, but drinks heavily on daily basis, says he drinks 3 to 4 bottles of liquor everyday and he drinks vodka. Pt states he used to attend AA and his last meeting was in July 2020. Pt denies histroy of abuse/trauma for himself and family. Pt reports dad was an alcoholic and died of liver cyrosis in 2009. Pt reports his sister experiences mental health issues and denied any family history of suicide. Pt reports he has no access to weapons but on parole for felony gun possession. Pt currently has no provider, no prior psych inpatient treatment history and is not taking any medications.Pt states he last seen a therapist when he was 15 for depression and was prescribed Seroquel. Pt unable to contract for safety at this time.    Per IVC, " Pt in ED after being found unresponsive in the park in subfreezing tempeture. Pt told police and friend that he wanted to kill himself"   Diagnosis:Major depressive disorder, Single episode, Severe         Alcohol use disorder  Past Medical History: No past medical history on file.  No past surgical history  on file.  Family History: No family history on file.  Social History:  reports that he has been smoking cigarettes. He has been smoking about 1.00 pack per day. He has never used smokeless tobacco. He reports that he does not drink alcohol or use drugs.  Additional Social History:  Alcohol / Drug Use Pain Medications: see MAR Prescriptions: see MAR Over the Counter: see MAR History of alcohol / drug use?: Yes  CIWA: CIWA-Ar BP: (!) 96/46 Pulse Rate: 73 COWS:    Allergies: No Known Allergies  Home  Medications: (Not in a hospital admission)   OB/GYN Status:  No LMP for male patient.  General Assessment Data Living Arrangements: Other (Comment) What gender do you identify as?: Male Marital status: Single Pregnancy Status: No Living Arrangements: Spouse/significant other Can pt return to current living arrangement?: Yes Admission Status: Involuntary Petitioner: Police Is patient capable of signing voluntary admission?: No Referral Source: Self/Family/Friend     Crisis Care Plan Living Arrangements: Spouse/significant other Legal Guardian: (self) Name of Psychiatrist: (none) Name of Therapist: (none)  Education Status Is patient currently in school?: No Is the patient employed, unemployed or receiving disability?: Employed  Risk to self with the past 6 months Suicidal Ideation: Yes-Currently Present Has patient been a risk to self within the past 6 months prior to admission? : No Suicidal Intent: No Has patient had any suicidal intent within the past 6 months prior to admission? : No Is patient at risk for suicide?: No Suicidal Plan?: Yes-Currently Present Has patient had any suicidal plan within the past 6 months prior to admission? : No Specify Current Suicidal Plan: pt states he wanted to hang himself Access to Means: No What has been your use of drugs/alcohol within the last 12 months?: marijuana, Previous Attempts/Gestures: Yes How many times?: 1 Other Self Harm Risks: depression Triggers for Past Attempts: Unknown Intentional Self Injurious Behavior: None Family Suicide History: No Recent stressful life event(s): Conflict (Comment), Financial Problems Persecutory voices/beliefs?: No Depression: Yes Depression Symptoms: Insomnia, Tearfulness, Isolating, Feeling worthless/self pity, Feeling angry/irritable Substance abuse history and/or treatment for substance abuse?: Yes Suicide prevention information given to non-admitted patients: Not applicable  Risk to  Others within the past 6 months Homicidal Ideation: No Does patient have any lifetime risk of violence toward others beyond the six months prior to admission? : No Thoughts of Harm to Others: No Current Homicidal Intent: No Current Homicidal Plan: No Access to Homicidal Means: No Identified Victim: NA History of harm to others?: Yes Assessment of Violence: None Noted Violent Behavior Description: NA Does patient have access to weapons?: No Criminal Charges Pending?: No Does patient have a court date: No Is patient on probation?: Yes  Psychosis Hallucinations: None noted Delusions: None noted  Mental Status Report Appearance/Hygiene: In scrubs Eye Contact: Good Motor Activity: Freedom of movement Speech: Logical/coherent Level of Consciousness: Alert Mood: Depressed Affect: Depressed, Appropriate to circumstance Anxiety Level: None Thought Processes: Coherent, Relevant Judgement: Partial Orientation: Person, Place, Time, Situation Obsessive Compulsive Thoughts/Behaviors: None  Cognitive Functioning Concentration: Good Memory: Recent Intact Is patient IDD: No Insight: Fair Impulse Control: Poor Appetite: Poor Have you had any weight changes? : (unknown) Sleep: Decreased Total Hours of Sleep: 3 Vegetative Symptoms: None  ADLScreening [facility] Assessment Services) Patient's cognitive ability adequate to safely complete daily activities?: Yes Patient able to express need for assistance with ADLs?: Yes Independently performs ADLs?: Yes (appropriate for developmental age)  Prior Inpatient Therapy Prior Inpatient Therapy: No  Prior Outpatient Therapy Prior  Outpatient Therapy: No Does patient have an ACCT team?: No Does patient have Intensive In-House Services?  : No Does patient have [facility] services? : No Does patient have P4CC services?: No  ADL Screening (condition at time of admission) Patient's cognitive ability adequate to safely complete daily activities?:  Yes Patient able to express need for assistance with ADLs?: Yes Independently performs ADLs?: Yes (appropriate for developmental age)                        Disposition:Jason, Allyson Sabal, FNP recommemds pt meets inpatient criteria, Per [facility] Aliene Altes, pt currently being reviewed for bed at [facility]. TTS confirmed status with attending provider.   This service was provided via telemedicine using a 2-way, interactive audio and video technology.  Names of all persons participating in this telemedicine service and their role in this encounter. Name: Jameir Ake Role: Patient  Name: Lacey Jensen Role: TTS  Name:  Role:   Name:  Role:     Natasha Mead 02/06/2019 1:30 AM

## 2019-02-06 NOTE — Consult Note (Signed)
[place] [facility] Consult   Reason for Consult: Substance abuse Referring Physician: Dr. Roxanne Mins Patient Identification: Justin Camacho MRN:  [id number] Principal Diagnosis: Found unresponsive due to substance ingestion/abuse Diagnosis: Benzodiazepine/cannabis/and alcohol intoxication/obtundation  Total Time spent with patient: 20 minutes  Subjective:   Justin Camacho is a 28 y.o. male patient admitted on 12/25 with a blood alcohol level of 199, drug screen showing benzodiazepines and cannabis, he was found unresponsive in a local park and apparently had been speaking of suicide however he denies this now.  Upon awakening and getting midazolam he was agitated and combative but became more cooperative.  On my exam today he has no recall of the statements asks "how long have I been here?"  And denies that he is suicidal denies that his ingestion of compounds was intentional, he is angered by the routine screening questions regarding substance abuse. Irritable and demanding to leave on this exam  HPI:  As above patient reported depression when interviewed yesterday stating after his incarceration he has had suicidal thoughts and stated he attempted to hang himself at age 38 and reported other depressive symptoms such as tearfulness worthlessness, isolation anxiety and irritability.  He certainly irritable on this exam but states he does not want detox measures, does not want further treatment.  Past Psychiatric History: Reports 1 past suicide attempt  Risk to Self: Suicidal Ideation: Yes-Currently Present Suicidal Intent: No Is patient at risk for suicide?: No Suicidal Plan?: Yes-Currently Present Specify Current Suicidal Plan: pt states he wanted to hang himself Access to Means: No What has been your use of drugs/alcohol within the last 12 months?: marijuana, How many times?: 1 Other Self Harm Risks: depression Triggers for Past Attempts: Unknown Intentional Self Injurious  Behavior: None Risk to Others: Homicidal Ideation: No Thoughts of Harm to Others: No Current Homicidal Intent: No Current Homicidal Plan: No Access to Homicidal Means: No Identified Victim: NA History of harm to others?: Yes Assessment of Violence: None Noted Violent Behavior Description: NA Does patient have access to weapons?: No Criminal Charges Pending?: No Does patient have a court date: No Prior Inpatient Therapy: Prior Inpatient Therapy: No Prior Outpatient Therapy: Prior Outpatient Therapy: No Does patient have an ACCT team?: No Does patient have Intensive In-House Services?  : No Does patient have [facility] services? : No Does patient have P4CC services?: No  Past Medical History: No past medical history on file. No past surgical history on file. Family History: No family history on file. Family Psychiatric  History: Unknown Social History:  Social History   Substance and Sexual Activity  Alcohol Use No     Social History   Substance and Sexual Activity  Drug Use No    Social History   Socioeconomic History  . Marital status: Single    Spouse name: Not on file  . Number of children: Not on file  . Years of education: Not on file  . Highest education level: Not on file  Occupational History  . Not on file  Tobacco Use  . Smoking status: Current Every Day Smoker    Packs/day: 1.00    Types: Cigarettes  . Smokeless tobacco: Never Used  Substance and Sexual Activity  . Alcohol use: No  . Drug use: No  . Sexual activity: Yes    Birth control/protection: None  Other Topics Concern  . Not on file  Social History Narrative  . Not on file   Social Determinants of Health   Financial Resource  Strain:   . Difficulty of Paying Living Expenses: Not on file  Food Insecurity:   . Worried About Programme researcher, broadcasting/film/videounning Out of Food in the Last Year: Not on file  . Ran Out of Food in the Last Year: Not on file  Transportation Needs:   . Lack of Transportation (Medical): Not on  file  . Lack of Transportation (Non-Medical): Not on file  Physical Activity:   . Days of Exercise per Week: Not on file  . Minutes of Exercise per Session: Not on file  Stress:   . Feeling of Stress : Not on file  Social Connections:   . Frequency of Communication with Friends and Family: Not on file  . Frequency of Social Gatherings with Friends and Family: Not on file  . Attends Religious Services: Not on file  . Active Member of Clubs or Organizations: Not on file  . Attends BankerClub or Organization Meetings: Not on file  . Marital Status: Not on file   Additional Social History:    Allergies:  No Known Allergies  Labs:  Results for orders placed or performed during the hospital encounter of 02/05/19 (from the past 48 hour(s))  Comprehensive metabolic panel     Status: Abnormal   Collection Time: 02/05/19  9:21 PM  Result Value Ref Range   Sodium 143 135 - 145 mmol/L   Potassium 4.4 3.5 - 5.1 mmol/L    Comment: SLIGHT HEMOLYSIS   Chloride 105 98 - 111 mmol/L   CO2 27 22 - 32 mmol/L   Glucose, Bld 85 70 - 99 mg/dL   BUN 24 (H) 6 - 20 mg/dL   Creatinine, Ser 1.611.40 (H) 0.61 - 1.24 mg/dL   Calcium 9.1 8.9 - 09.610.3 mg/dL   Total Protein 7.7 6.5 - 8.1 g/dL   Albumin 4.2 3.5 - 5.0 g/dL   AST 33 15 - 41 U/L   ALT 14 0 - 44 U/L   Alkaline Phosphatase 61 38 - 126 U/L   Total Bilirubin 1.0 0.3 - 1.2 mg/dL   GFR calc non Af Amer >60 >60 mL/min   GFR calc Af Amer >60 >60 mL/min   Anion gap 11 5 - 15    Comment: Performed at [facility], [address] [place], [place] [address]  Ethanol     Status: Abnormal   Collection Time: 02/05/19  9:21 PM  Result Value Ref Range   Alcohol, Ethyl (B) 199 (H) <10 mg/dL    Comment: (NOTE) Lowest detectable limit for serum alcohol is 10 mg/dL. For medical purposes only. Performed at [facility], [address] [place], [place] [address]   Salicylate level     Status: Abnormal   Collection Time:  02/05/19  9:21 PM  Result Value Ref Range   Salicylate Lvl <7.0 (L) 7.0 - 30.0 mg/dL    Comment: Performed at [facility], [address] [place], [place] [address]  Acetaminophen level     Status: Abnormal   Collection Time: 02/05/19  9:21 PM  Result Value Ref Range   Acetaminophen (Tylenol), Serum <10 (L) 10 - 30 ug/mL    Comment: (NOTE) Therapeutic concentrations vary significantly. A range of 10-30 ug/mL  may be an effective concentration for many patients. However, some  are best treated at concentrations outside of this range. Acetaminophen concentrations >150 ug/mL at 4 hours after ingestion  and >50 ug/mL at 12 hours after ingestion are often associated with  toxic reactions. Performed at  [facility], [address], [place], [place] [address]   cbc     Status: None   Collection Time: 02/05/19  9:21 PM  Result Value Ref Range   WBC 6.1 4.0 - 10.5 K/uL   RBC 5.18 4.22 - 5.81 MIL/uL   Hemoglobin 14.8 13.0 - 17.0 g/dL   HCT 32.9 51.8 - 84.1 %   MCV 90.3 80.0 - 100.0 fL   MCH 28.6 26.0 - 34.0 pg   MCHC 31.6 30.0 - 36.0 g/dL   RDW 66.0 63.0 - 16.0 %   Platelets 213 150 - 400 K/uL   nRBC 0.0 0.0 - 0.2 %    Comment: Performed at [facility], [address], [place], [place] [address]  Rapid urine drug screen (hospital performed)     Status: Abnormal   Collection Time: 02/05/19  9:21 PM  Result Value Ref Range   Opiates NONE DETECTED NONE DETECTED   Cocaine NONE DETECTED NONE DETECTED   Benzodiazepines POSITIVE (A) NONE DETECTED   Amphetamines NONE DETECTED NONE DETECTED   Tetrahydrocannabinol POSITIVE (A) NONE DETECTED   Barbiturates NONE DETECTED NONE DETECTED    Comment: (NOTE) DRUG SCREEN FOR MEDICAL PURPOSES ONLY.  IF CONFIRMATION IS NEEDED FOR ANY PURPOSE, NOTIFY LAB WITHIN 5 DAYS. LOWEST DETECTABLE LIMITS FOR URINE DRUG SCREEN Drug Class                     Cutoff (ng/mL) Amphetamine and metabolites     1000 Barbiturate and metabolites    200 Benzodiazepine                 200 Tricyclics and metabolites     300 Opiates and metabolites        300 Cocaine and metabolites        300 THC                            50 Performed at [facility], [address] [place], [place] [address]   SARS CORONAVIRUS 2 (TAT 6-24 HRS) Nasopharyngeal Nasopharyngeal Swab     Status: None   Collection Time: 02/05/19 11:45 PM   Specimen: Nasopharyngeal Swab  Result Value Ref Range   SARS Coronavirus 2 NEGATIVE NEGATIVE    Comment: (NOTE) SARS-CoV-2 target nucleic acids are NOT DETECTED. The SARS-CoV-2 RNA is generally detectable in upper and lower respiratory specimens during the acute phase of infection. Negative results do not preclude SARS-CoV-2 infection, do not rule out co-infections with other pathogens, and should not be used as the sole basis for treatment or other patient management decisions. Negative results must be combined with clinical observations, patient history, and epidemiological information. The expected result is Negative. Fact Sheet for Patients: HairSlick.no Fact Sheet for Healthcare Providers: quierodirigir.com This test is not yet approved or cleared by the [place] FDA and  has been authorized for detection and/or diagnosis of SARS-CoV-2 by FDA under an Emergency Use Authorization (EUA). This EUA will remain  in effect (meaning this test can be used) for the duration of the COVID-19 declaration under Section 56 4(b)(1) of the Act, 21 U.S.C. section 360bbb-3(b)(1), unless the authorization is terminated or revoked sooner. Performed at [facility], [address] [place], [place] [address]     Current Facility-Administered Medications  Medication Dose Route Frequency Provider Last Rate Last Admin  . acetaminophen (TYLENOL) tablet 650 mg  650 mg  Oral Q4H PRN Dione Booze, MD      .  alum & mag hydroxide-simeth (MAALOX/MYLANTA) 200-200-20 MG/5ML suspension 30 mL  30 mL Oral Q6H PRN Dione Booze, MD      . nicotine (NICODERM CQ - dosed in mg/24 hours) patch 14 mg  14 mg Transdermal Daily Dione Booze, MD      . ondansetron [facility]) tablet 4 mg  4 mg Oral Q8H PRN Dione Booze, MD       Current Outpatient Medications  Medication Sig Dispense Refill  . loratadine (CLARITIN) 10 MG tablet Take 1 tablet (10 mg total) by mouth daily. 30 tablet 0    Musculoskeletal: Strength & Muscle Tone: within normal limits Gait & Station: normal Patient leans: N/A  Psychiatric Specialty Exam: Physical Exam  Review of Systems  Blood pressure 104/72, pulse 61, temperature 97.7 F (36.5 C), temperature source Oral, resp. rate 18, height 6\' 2"  (1.88 m), weight 68 kg, SpO2 98 %.Body mass index is 19.26 kg/m.  General Appearance: Casual  Eye Contact:  Fair  Speech:  Clear and Coherent  Volume:  Increased  Mood:  Angry and Irritable  Affect:  Congruent  Thought Process:  Goal Directed  Orientation:  Full (Time, Place, and Person)  Thought Content:  Denies hallucinations denies thoughts of harming self now  Suicidal Thoughts:  No  Homicidal Thoughts:  No  Memory:  Immediate;   Poor Recent;   Fair Remote;   Fair  Judgement:  Good  Insight:  Shallow  Psychomotor Activity:  Normal  Concentration:  Concentration: Fair and Attention Span: Good  Recall:  of Knowledge:  Fair  Language:  Fair  Akathisia:  Negative  Handed:  Right  AIMS (if indicated):     Assets:  Leisure Time Physical Health  ADL's:  Intact  Cognition:  WNL  Sleep:        Treatment Plan Summary: Plan Patient has been monitored he now denies suicidal thoughts plans or intent I recommended that he stay for treatment however he insists that he is not suicidal and wants to leave.  This contradicts previous information so he is screened a second time and insists he is not suicidal.  Further he states he  will not cooperate if he stays and he is already agitated demanding to leave immediately.  Disposition: Patient does not meet criteria for psychiatric inpatient admission.  [employer], MD 02/06/2019 9:20 AM

## 2019-02-06 NOTE — ED Notes (Signed)
Set up the TTS machine for his assessment. He yelled out in an angry tone "where are my kids" Unsure where his kids are he states they are 1 and 30 yo. No other information shared. Explained TTS process and that Probation officer doesn't make decision about his disposition.

## 2019-02-06 NOTE — ED Notes (Signed)
Received patient from room 6. He ambulated to this unit and in bed 31. He states when asked he feels bad. Asked him to be more specific states he" just doesn't want to be here." Pleasant and cooperative. Explained the process that he is IVC and Dr will see him in the am and determine if he stays or leaves. Offered him food and drink, accepted a soda. Gave him a warm blanket and eyes are closed, quiet and in bed.

## 2019-02-06 NOTE — ED Notes (Signed)
Note from TTS indicates he is being considered for a bed at [facility]. Waiting to hear more on his disposition.

## 2019-02-06 NOTE — ED Notes (Signed)
Dr. Sheppard Evens and Lorelee Cover doing rounds. Notified patient would discharge today. Allowed patient to use phone to contact ride.

## 2019-02-06 NOTE — ED Notes (Signed)
Pt's mother called again to ask for update. Ms. Wayne Both was again made aware that no information about the patient could be given over the phone. Ms. Wayne Both also made aware that if he was able to and it was alright with the RN that the pt may possibly be able to call her around 10:00 am.

## 2019-02-06 NOTE — ED Notes (Signed)
Pt ambulated to room 31 in TCU from room 6 without difficulty or assistance. Belongings secured in [place].

## 2019-02-06 NOTE — ED Provider Notes (Signed)
Care assumed from Dr. Ralene Bathe, patient with suicidal ideation but unable to do TTS consultation because sedation with midazolam.  CT of head is unremarkable.  Labs are significant for a mild elevation of creatinine compared with 5 years ago, unclear if this is actually an acute kidney injury.  Drug screen is positive for tetrahydrocannabinol and benzodiazepines, ethanol level elevated at 199.  Patient is now awake and able to answer questions.  Will request TTS consultation.  Results for orders placed or performed during the hospital encounter of 02/05/19  Comprehensive metabolic panel  Result Value Ref Range   Sodium 143 135 - 145 mmol/L   Potassium 4.4 3.5 - 5.1 mmol/L   Chloride 105 98 - 111 mmol/L   CO2 27 22 - 32 mmol/L   Glucose, Bld 85 70 - 99 mg/dL   BUN 24 (H) 6 - 20 mg/dL   Creatinine, Ser 1.40 (H) 0.61 - 1.24 mg/dL   Calcium 9.1 8.9 - 10.3 mg/dL   Total Protein 7.7 6.5 - 8.1 g/dL   Albumin 4.2 3.5 - 5.0 g/dL   AST 33 15 - 41 U/L   ALT 14 0 - 44 U/L   Alkaline Phosphatase 61 38 - 126 U/L   Total Bilirubin 1.0 0.3 - 1.2 mg/dL   GFR calc non Af Amer >60 >60 mL/min   GFR calc Af Amer >60 >60 mL/min   Anion gap 11 5 - 15  Ethanol  Result Value Ref Range   Alcohol, Ethyl (B) 199 (H) <75 mg/dL  Salicylate level  Result Value Ref Range   Salicylate Lvl <6.4 (L) 7.0 - 30.0 mg/dL  Acetaminophen level  Result Value Ref Range   Acetaminophen (Tylenol), Serum <10 (L) 10 - 30 ug/mL  cbc  Result Value Ref Range   WBC 6.1 4.0 - 10.5 K/uL   RBC 5.18 4.22 - 5.81 MIL/uL   Hemoglobin 14.8 13.0 - 17.0 g/dL   HCT 46.8 39.0 - 52.0 %   MCV 90.3 80.0 - 100.0 fL   MCH 28.6 26.0 - 34.0 pg   MCHC 31.6 30.0 - 36.0 g/dL   RDW 14.3 11.5 - 15.5 %   Platelets 213 150 - 400 K/uL   nRBC 0.0 0.0 - 0.2 %  Rapid urine drug screen (hospital performed)  Result Value Ref Range   Opiates NONE DETECTED NONE DETECTED   Cocaine NONE DETECTED NONE DETECTED   Benzodiazepines POSITIVE (A) NONE DETECTED   Amphetamines NONE DETECTED NONE DETECTED   Tetrahydrocannabinol POSITIVE (A) NONE DETECTED   Barbiturates NONE DETECTED NONE DETECTED   CT Head Wo Contrast  Result Date: 02/05/2019 CLINICAL DATA:  28 year old male with altered mental status. EXAM: CT HEAD WITHOUT CONTRAST TECHNIQUE: Contiguous axial images were obtained from the base of the skull through the vertex without intravenous contrast. COMPARISON:  Head CT dated 07/21/2016. FINDINGS: Brain: The ventricles and sulci appropriate size for patient's age. The gray-white matter discrimination is preserved. There is no acute intracranial hemorrhage. No mass effect or midline shift. No extra-axial fluid collection. Vascular: No hyperdense vessel or unexpected calcification. Skull: Normal. Negative for fracture or focal lesion. Sinuses/Orbits: Chronic opacification of the left maxillary sinus. The mastoid air cells are clear. Other: None IMPRESSION: Unremarkable noncontrast CT of the brain. Electronically Signed   By: Anner Crete M.D.   On: 33/29/5188 41:66      Delora Fuel, MD 08/11/14 [phone]

## 2019-02-06 NOTE — ED Notes (Signed)
Pt discharged home. Discharged instructions read to pt who verbalized understanding. All belongings returned to pt who signed for same. Denies SI/HI, is not delusional and not responding to internal stimuli. Escorted pt to the ED exit.    

## 2020-06-19 ENCOUNTER — Emergency Department (HOSPITAL_COMMUNITY)
Admission: EM | Admit: 2020-06-19 | Discharge: 2020-06-19 | Disposition: A | Discharge status: Home / Self Care | Payer: Self-pay | Attending: Emergency Medicine | Admitting: Emergency Medicine

## 2020-06-19 ENCOUNTER — Encounter (HOSPITAL_COMMUNITY): Payer: Self-pay | Admitting: Emergency Medicine

## 2020-06-19 ENCOUNTER — Other Ambulatory Visit: Payer: Self-pay

## 2020-06-19 DIAGNOSIS — N4889 Other specified disorders of penis: Secondary | ICD-10-CM | POA: Insufficient documentation

## 2020-06-19 DIAGNOSIS — R21 Rash and other nonspecific skin eruption: Secondary | ICD-10-CM

## 2020-06-19 DIAGNOSIS — F17210 Nicotine dependence, cigarettes, uncomplicated: Secondary | ICD-10-CM | POA: Insufficient documentation

## 2020-06-19 MED ORDER — VALACYCLOVIR HCL 500 MG PO TABS
500.0000 mg | ORAL_TABLET | Freq: Once | ORAL | Status: AC
  Administered 2020-06-19: 500 mg via ORAL
  Filled 2020-06-19: qty 1

## 2020-06-19 MED ORDER — VALACYCLOVIR HCL 1 G PO TABS: 1000.0000 mg | ORAL_TABLET | Freq: Three times a day (TID) | ORAL | 0 refills | Status: AC

## 2020-06-19 NOTE — ED Triage Notes (Signed)
Patient reports penile skin irritation/rash this evening . No itching/no discharge .

## 2020-06-20 NOTE — ED Provider Notes (Signed)
[facility] EMERGENCY DEPARTMENT Provider Note   CSN: [id number] Arrival date & time: 06/19/20  0023     History Chief Complaint  Patient presents with  . Penile Rash/Irritation     Justin Camacho is a 30 y.o. male.  Patient states that they had a penile rash that started earlier today.  States he is sexual active with his wife only.  Does not have any suspicion that she is cheating on him.  States the rash does not itch, burn or hurt.  Has never had anything like it before.  He states that it is expressing some clear yellowish fluid.  No rashes elsewhere.  No dysuria.No other associated symptoms.        History reviewed. No pertinent past medical history.  There are no problems to display for this patient.   History reviewed. No pertinent surgical history.     No family history on file.  Social History   Tobacco Use  . Smoking status: Current Every Day Smoker    Packs/day: 1.00    Types: Cigarettes  . Smokeless tobacco: Never Used  Substance Use Topics  . Alcohol use: No  . Drug use: No    Home Medications Prior to Admission medications   Medication Sig Start Date End Date Taking? Authorizing Provider  valACYclovir (VALTREX) 1000 MG tablet Take 1 tablet (1,000 mg total) by mouth 3 (three) times daily. 06/19/20  Yes Abraham Margulies, Barbara Cower, MD  loratadine (CLARITIN) 10 MG tablet Take 1 tablet (10 mg total) by mouth daily. 10/21/18 11/20/18  Couture, Cortni S, PA-C    Allergies    Patient has no known allergies.  Review of Systems   Review of Systems  All other systems reviewed and are negative.   Physical Exam Updated Vital Signs BP 134/74   Pulse 71   Temp 98.4 F (36.9 C) (Oral)   Resp 17   SpO2 100%   Physical Exam Vitals and nursing note reviewed.  Constitutional:      Appearance: He is well-developed.  HENT:     Head: Normocephalic and atraumatic.     Mouth/Throat:     Mouth: Mucous membranes are moist.     Pharynx: Oropharynx is  clear.  Cardiovascular:     Rate and Rhythm: Normal rate.  Pulmonary:     Effort: Pulmonary effort is normal. No respiratory distress.  Abdominal:     General: Abdomen is flat. There is no distension.  Genitourinary:    Comments: A couple areas of grouped ulcerative appearing lesions on glans and shaft. All have been deroofed.  Musculoskeletal:        General: Normal range of motion.     Cervical back: Normal range of motion.  Skin:    General: Skin is warm and dry.  Neurological:     General: No focal deficit present.     Mental Status: He is alert.     ED Results / Procedures / Treatments   Labs (all labs ordered are listed, but only abnormal results are displayed) Labs Reviewed  HERPES SIMPLEX VIRUS(HSV) DNA BY PCR    EKG None  Radiology No results found.  Procedures Procedures   Medications Ordered in ED Medications  valACYclovir (VALTREX) tablet 500 mg (500 mg Oral Given 06/19/20 0324)    ED Course  I have reviewed the triage vital signs and the nursing notes.  Pertinent labs & imaging results that were available during my care of the patient were reviewed by me  and considered in my medical decision making (see chart for details).    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 Stable for discharge.   Final Clinical Impression(s) / ED Diagnoses Final diagnoses:  Penile rash    Rx / DC Orders ED Discharge Orders         Ordered    valACYclovir (VALTREX) 1000 MG tablet  3 times daily        06/19/20 0306           Maleiah Dula, Barbara Cower, MD 06/20/20 0144

## 2020-06-21 LAB — HSV DNA BY PCR (REFERENCE LAB)
HSV 1 DNA: NEGATIVE
HSV 2 DNA: POSITIVE — AB

## 2020-12-09 ENCOUNTER — Encounter (HOSPITAL_COMMUNITY): Payer: Self-pay | Admitting: Emergency Medicine

## 2020-12-09 ENCOUNTER — Emergency Department (HOSPITAL_COMMUNITY)
Admission: EM | Admit: 2020-12-09 | Discharge: 2020-12-09 | Disposition: A | Discharge status: Home / Self Care | Payer: Self-pay | Attending: Student | Admitting: Student

## 2020-12-09 ENCOUNTER — Emergency Department (HOSPITAL_COMMUNITY): Payer: Self-pay

## 2020-12-09 ENCOUNTER — Other Ambulatory Visit: Payer: Self-pay

## 2020-12-09 DIAGNOSIS — W228XXA Striking against or struck by other objects, initial encounter: Secondary | ICD-10-CM | POA: Insufficient documentation

## 2020-12-09 DIAGNOSIS — M7989 Other specified soft tissue disorders: Secondary | ICD-10-CM | POA: Insufficient documentation

## 2020-12-09 DIAGNOSIS — M79641 Pain in right hand: Secondary | ICD-10-CM | POA: Insufficient documentation

## 2020-12-09 DIAGNOSIS — Z5321 Procedure and treatment not carried out due to patient leaving prior to being seen by health care provider: Secondary | ICD-10-CM | POA: Insufficient documentation

## 2020-12-09 MED ORDER — TETANUS-DIPHTH-ACELL PERTUSSIS 5-2.5-18.5 LF-MCG/0.5 IM SUSY: 0.5000 mL | PREFILLED_SYRINGE | Freq: Once | INTRAMUSCULAR | Status: DC

## 2020-12-09 NOTE — ED Provider Notes (Signed)
Emergency Medicine Provider Triage Evaluation Note  Justin Camacho , a 30 y.o. male  was evaluated in triage.  Pt complains of right hand pain, swelling, excoriation of the right hand after punching the glass in the back of a car last night. Patient reports significant pain, rates pain 9/10. Has not taken anything for pain at this time. Unsure when most recent tetanus shot was.  Review of Systems  Positive: Hand pain, excoriation Negative: Purulent drainage, fever  Physical Exam  There were no vitals taken for this visit. Gen:   Awake, no distress   Resp:  Normal effort  MSK:   Moves extremities without difficulty, right hand swelling, pain. Tenderness across MCPs, metacarpals of digits 3-5 Other:  Intact radial and ulnar pulses  Medical Decision Making  Medically screening exam initiated at 9:48 AM.  Appropriate orders placed.  Justin Camacho was informed that the remainder of the evaluation will be completed by another provider, this initial triage assessment does not replace that evaluation, and the importance of remaining in the ED until their evaluation is complete.  Right hand injury   [place] 12/09/20 [id number]    Glendora Score, MD 12/09/20 1818

## 2020-12-09 NOTE — ED Triage Notes (Signed)
Pt states he punched the back glass on a car yesterday.  C/o pain, swelling, and abrasions to R hand.

## 2022-05-11 IMAGING — CR DG HAND COMPLETE 3+V*R*
4 series · 4 of 4 positions shown · non-contrast
Comparison: None.

CLINICAL DATA: Hand pain and swelling.

EXAM:
RIGHT HAND - COMPLETE 3+ VIEW

[hand obl]
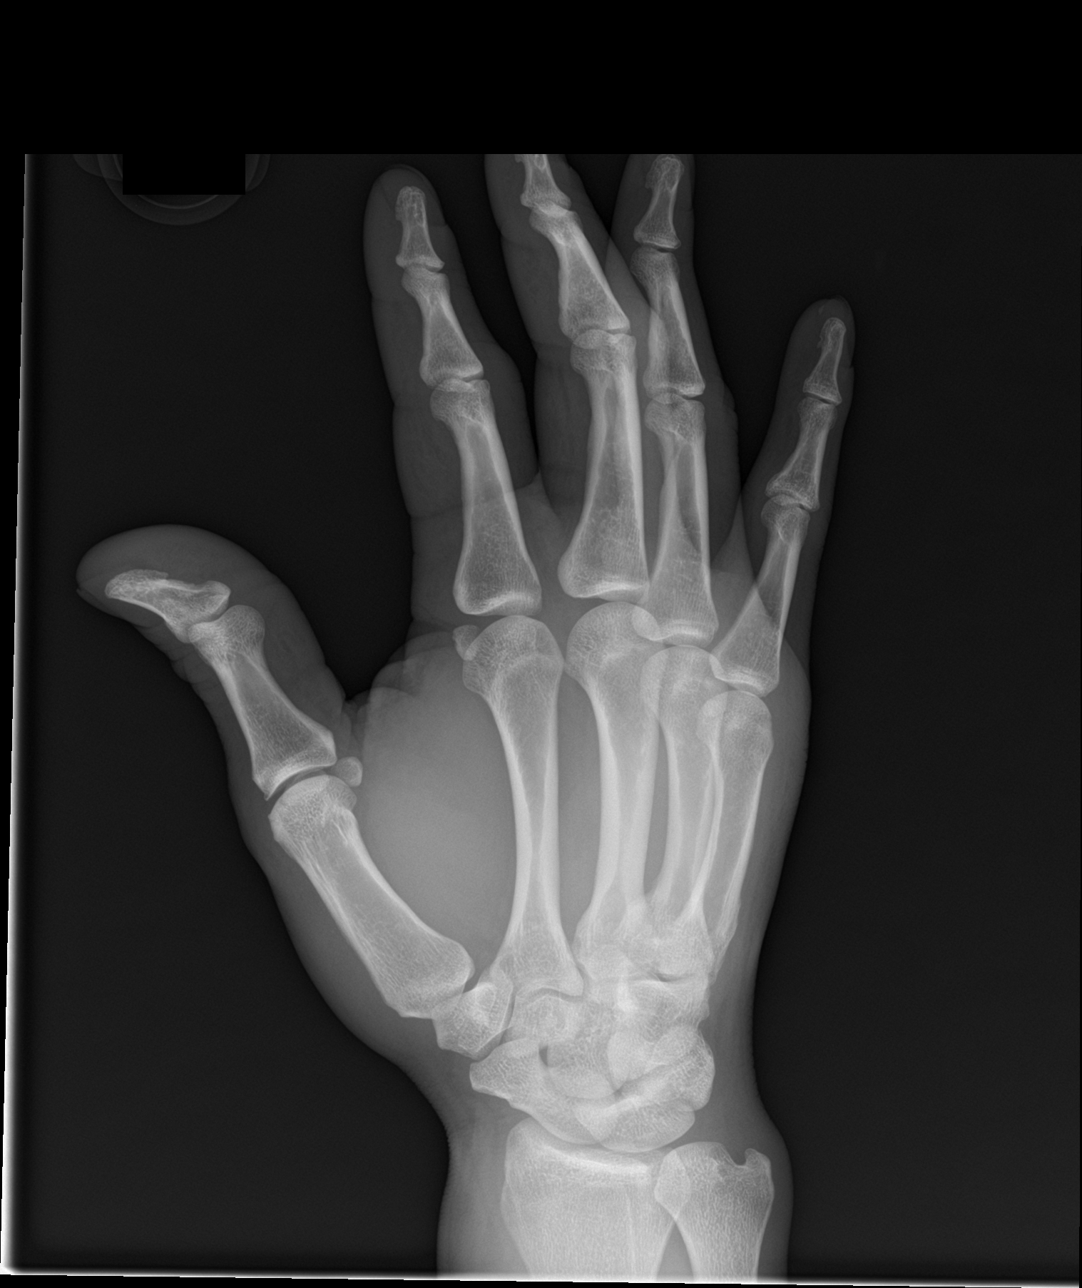

[hand lat (1 of 2)]
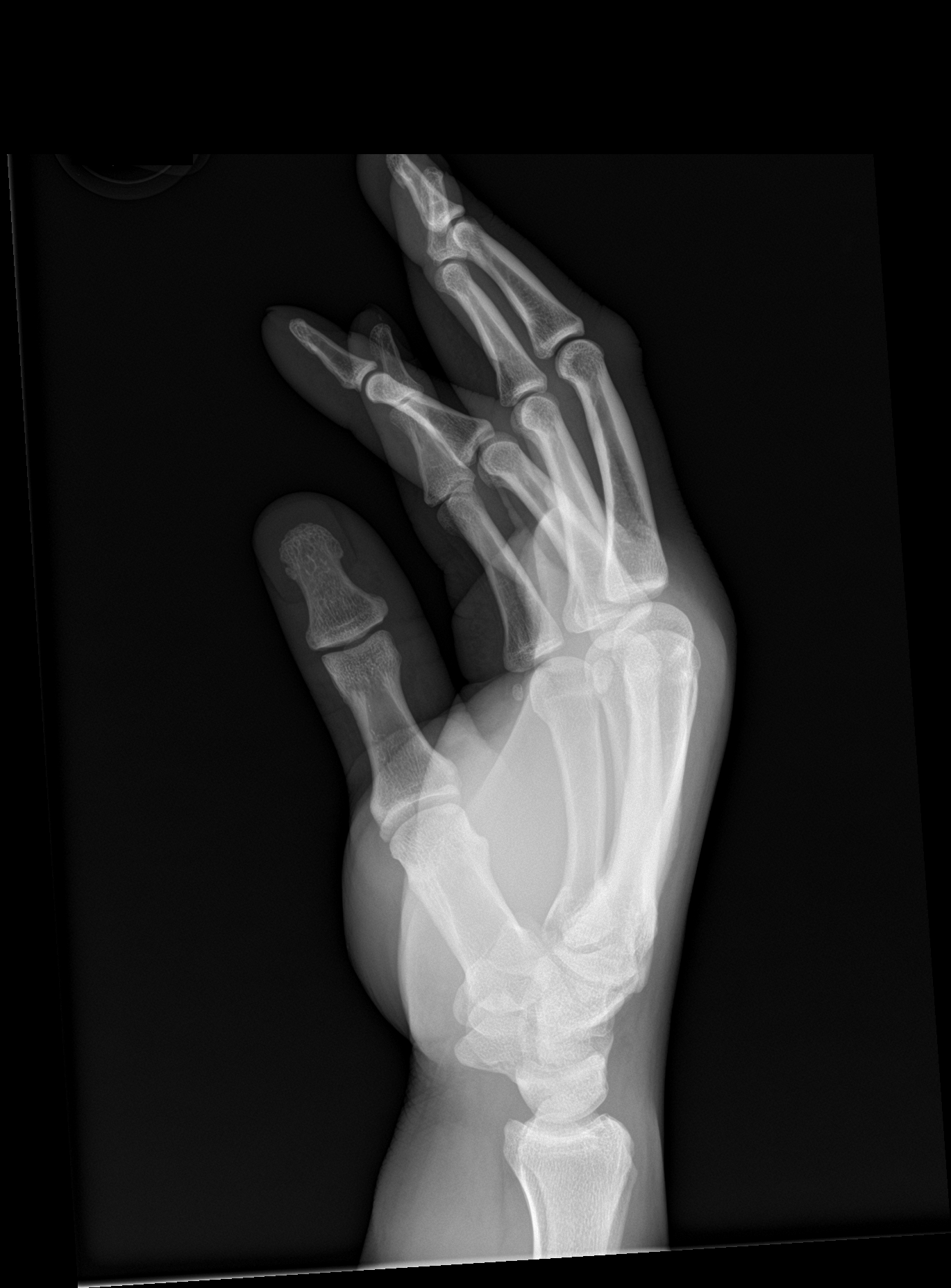

[hand pa]
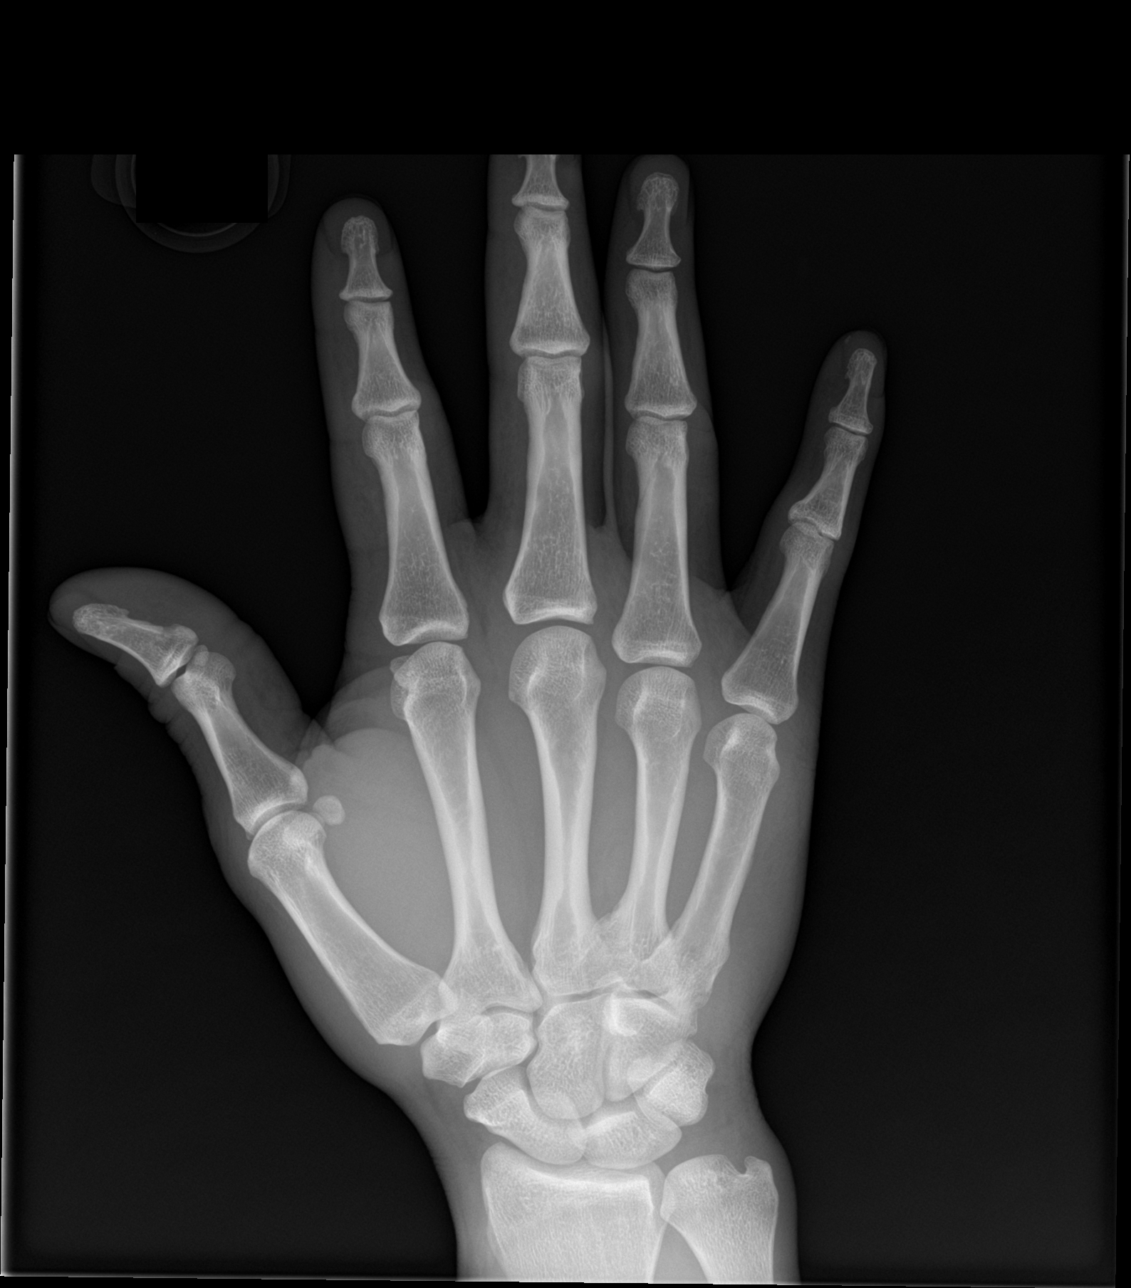

[hand lat (2 of 2)]
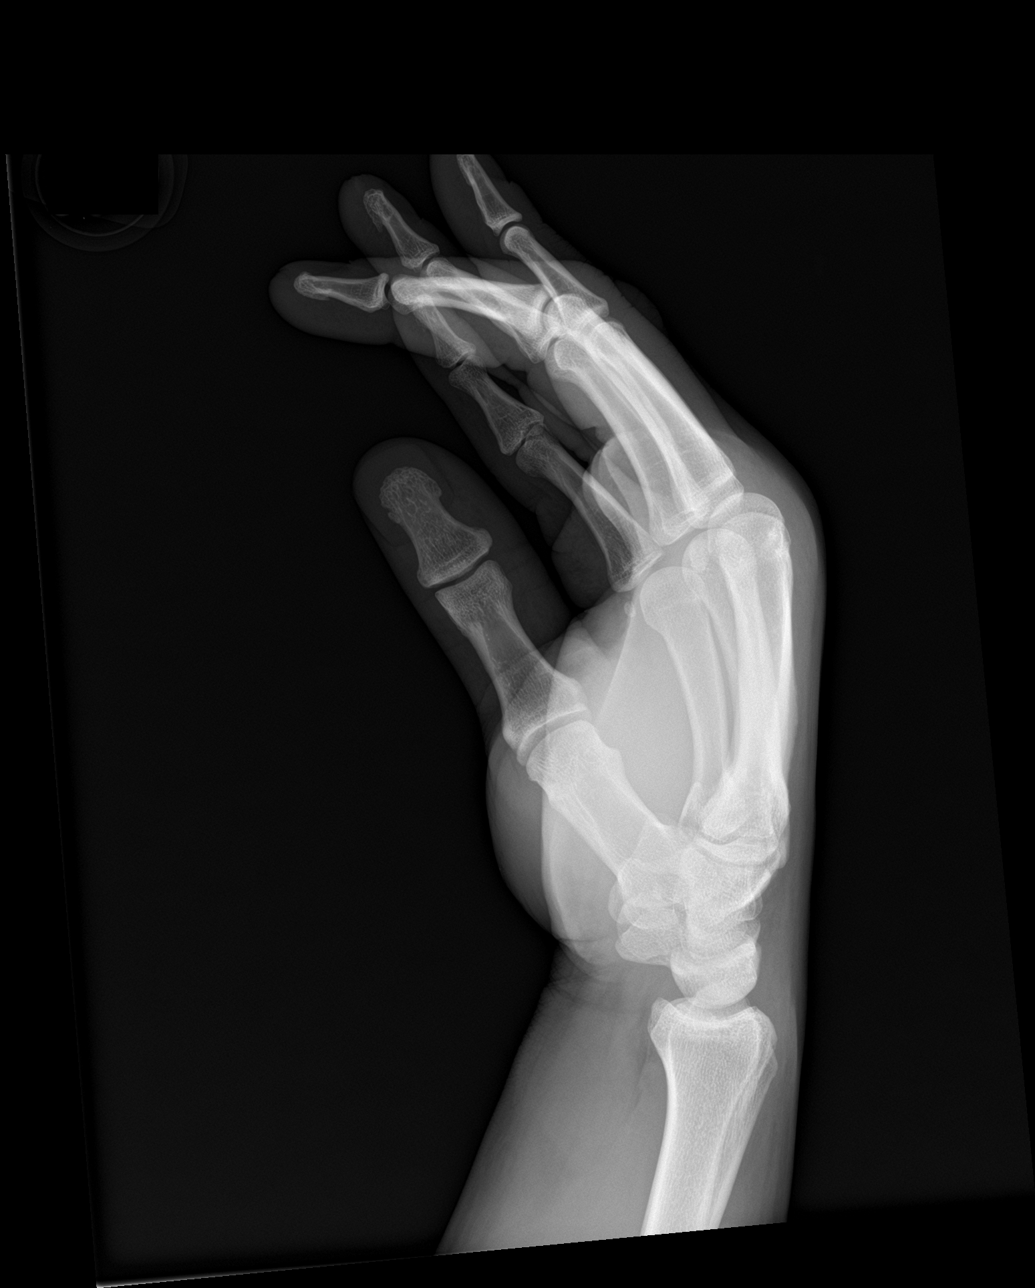

[4 of 4 positions shown; findings below may reference images not displayed]

FINDINGS: There is no evidence of fracture or dislocation. There is no
evidence of arthropathy or other focal bone abnormality. Soft
tissues are unremarkable.
IMPRESSION: Negative.
